# Patient Record
Sex: Female | Born: 1935 | Race: White | Hispanic: No | Marital: Single | Smoking: Never smoker
Health system: Southern US, Community
[De-identification: ages and names within clinical notes are randomized; demographics above are authoritative.]

## PROBLEM LIST (undated history)

## (undated) DIAGNOSIS — K222 Esophageal obstruction: Secondary | ICD-10-CM

## (undated) DIAGNOSIS — C801 Malignant (primary) neoplasm, unspecified: Secondary | ICD-10-CM

## (undated) DIAGNOSIS — K648 Other hemorrhoids: Secondary | ICD-10-CM

## (undated) DIAGNOSIS — I10 Essential (primary) hypertension: Secondary | ICD-10-CM

## (undated) DIAGNOSIS — K2980 Duodenitis without bleeding: Secondary | ICD-10-CM

## (undated) DIAGNOSIS — K2100 Gastro-esophageal reflux disease with esophagitis, without bleeding: Secondary | ICD-10-CM

## (undated) DIAGNOSIS — I639 Cerebral infarction, unspecified: Secondary | ICD-10-CM

## (undated) DIAGNOSIS — E785 Hyperlipidemia, unspecified: Secondary | ICD-10-CM

## (undated) DIAGNOSIS — M810 Age-related osteoporosis without current pathological fracture: Secondary | ICD-10-CM

## (undated) DIAGNOSIS — K5730 Diverticulosis of large intestine without perforation or abscess without bleeding: Secondary | ICD-10-CM

## (undated) HISTORY — DX: Duodenitis without bleeding: K29.80

## (undated) HISTORY — DX: Gastro-esophageal reflux disease with esophagitis, without bleeding: K21.00

## (undated) HISTORY — DX: Gastro-esophageal reflux disease with esophagitis: K21.0

## (undated) HISTORY — DX: Malignant (primary) neoplasm, unspecified: C80.1

## (undated) HISTORY — DX: Diverticulosis of large intestine without perforation or abscess without bleeding: K57.30

## (undated) HISTORY — DX: Esophageal obstruction: K22.2

## (undated) HISTORY — DX: Other hemorrhoids: K64.8

---

## 1998-02-27 ENCOUNTER — Other Ambulatory Visit: Admission: RE | Admit: 1998-02-27 | Discharge: 1998-02-27 | Payer: Self-pay | Admitting: Obstetrics and Gynecology

## 1998-12-21 ENCOUNTER — Inpatient Hospital Stay (HOSPITAL_COMMUNITY): Admission: EM | Admit: 1998-12-21 | Discharge: 1998-12-23 | Payer: Self-pay | Admitting: *Deleted

## 1998-12-21 ENCOUNTER — Encounter: Payer: Self-pay | Admitting: *Deleted

## 1999-03-01 ENCOUNTER — Other Ambulatory Visit: Admission: RE | Admit: 1999-03-01 | Discharge: 1999-03-01 | Payer: Self-pay | Admitting: Obstetrics and Gynecology

## 2000-02-29 ENCOUNTER — Other Ambulatory Visit: Admission: RE | Admit: 2000-02-29 | Discharge: 2000-02-29 | Payer: Self-pay | Admitting: Obstetrics and Gynecology

## 2000-10-06 ENCOUNTER — Encounter: Payer: Self-pay | Admitting: Obstetrics and Gynecology

## 2000-10-06 ENCOUNTER — Encounter: Admission: RE | Admit: 2000-10-06 | Discharge: 2000-10-06 | Payer: Self-pay | Admitting: Obstetrics and Gynecology

## 2001-03-02 ENCOUNTER — Other Ambulatory Visit: Admission: RE | Admit: 2001-03-02 | Discharge: 2001-03-02 | Payer: Self-pay | Admitting: Obstetrics and Gynecology

## 2002-02-24 ENCOUNTER — Other Ambulatory Visit: Admission: RE | Admit: 2002-02-24 | Discharge: 2002-02-24 | Payer: Self-pay | Admitting: Obstetrics and Gynecology

## 2002-12-04 ENCOUNTER — Inpatient Hospital Stay (HOSPITAL_COMMUNITY): Admission: EM | Admit: 2002-12-04 | Discharge: 2002-12-10 | Payer: Self-pay | Admitting: Emergency Medicine

## 2002-12-04 ENCOUNTER — Encounter: Payer: Self-pay | Admitting: Emergency Medicine

## 2002-12-05 ENCOUNTER — Encounter: Payer: Self-pay | Admitting: Neurology

## 2002-12-06 ENCOUNTER — Encounter (INDEPENDENT_AMBULATORY_CARE_PROVIDER_SITE_OTHER): Payer: Self-pay | Admitting: *Deleted

## 2003-02-01 ENCOUNTER — Encounter: Admission: RE | Admit: 2003-02-01 | Discharge: 2003-02-01 | Payer: Self-pay | Admitting: Internal Medicine

## 2003-06-25 ENCOUNTER — Emergency Department (HOSPITAL_COMMUNITY): Admission: EM | Admit: 2003-06-25 | Discharge: 2003-06-26 | Payer: Self-pay | Admitting: Emergency Medicine

## 2003-07-20 ENCOUNTER — Encounter (INDEPENDENT_AMBULATORY_CARE_PROVIDER_SITE_OTHER): Payer: Self-pay | Admitting: Specialist

## 2003-07-20 ENCOUNTER — Ambulatory Visit (HOSPITAL_COMMUNITY): Admission: RE | Admit: 2003-07-20 | Discharge: 2003-07-21 | Payer: Self-pay | Admitting: Orthopaedic Surgery

## 2004-02-28 ENCOUNTER — Other Ambulatory Visit: Admission: RE | Admit: 2004-02-28 | Discharge: 2004-02-28 | Payer: Self-pay | Admitting: Obstetrics and Gynecology

## 2004-03-04 HISTORY — PX: ESOPHAGOGASTRODUODENOSCOPY: SHX1529

## 2004-03-04 HISTORY — PX: COLONOSCOPY: SHX174

## 2004-04-23 ENCOUNTER — Ambulatory Visit: Payer: Self-pay | Admitting: Internal Medicine

## 2004-04-24 ENCOUNTER — Ambulatory Visit: Payer: Self-pay | Admitting: Internal Medicine

## 2004-05-08 ENCOUNTER — Ambulatory Visit: Payer: Self-pay | Admitting: Internal Medicine

## 2004-05-18 ENCOUNTER — Ambulatory Visit: Payer: Self-pay | Admitting: Internal Medicine

## 2004-07-25 ENCOUNTER — Encounter (INDEPENDENT_AMBULATORY_CARE_PROVIDER_SITE_OTHER): Payer: Self-pay | Admitting: Specialist

## 2004-07-25 ENCOUNTER — Ambulatory Visit: Payer: Self-pay | Admitting: Physical Medicine & Rehabilitation

## 2004-07-25 ENCOUNTER — Inpatient Hospital Stay (HOSPITAL_COMMUNITY): Admission: RE | Admit: 2004-07-25 | Discharge: 2004-08-03 | Payer: Self-pay | Admitting: General Surgery

## 2004-08-09 ENCOUNTER — Ambulatory Visit (HOSPITAL_COMMUNITY): Admission: RE | Admit: 2004-08-09 | Discharge: 2004-08-09 | Payer: Self-pay | Admitting: General Surgery

## 2006-08-05 ENCOUNTER — Other Ambulatory Visit: Admission: RE | Admit: 2006-08-05 | Discharge: 2006-08-05 | Payer: Self-pay | Admitting: Obstetrics and Gynecology

## 2007-09-08 ENCOUNTER — Ambulatory Visit (HOSPITAL_COMMUNITY): Admission: RE | Admit: 2007-09-08 | Discharge: 2007-09-08 | Payer: Self-pay | Admitting: Internal Medicine

## 2008-07-05 ENCOUNTER — Inpatient Hospital Stay (HOSPITAL_COMMUNITY): Admission: EM | Admit: 2008-07-05 | Discharge: 2008-07-11 | Payer: Self-pay | Admitting: Emergency Medicine

## 2009-11-09 ENCOUNTER — Inpatient Hospital Stay (HOSPITAL_COMMUNITY): Admission: EM | Admit: 2009-11-09 | Discharge: 2009-12-04 | Payer: Self-pay | Admitting: Emergency Medicine

## 2009-11-09 ENCOUNTER — Encounter (INDEPENDENT_AMBULATORY_CARE_PROVIDER_SITE_OTHER): Payer: Self-pay | Admitting: General Surgery

## 2010-01-20 ENCOUNTER — Emergency Department (HOSPITAL_COMMUNITY)
Admission: EM | Admit: 2010-01-20 | Discharge: 2010-01-20 | Payer: Self-pay | Source: Home / Self Care | Admitting: Emergency Medicine

## 2010-05-17 LAB — CBC
HCT: 29.5 % — ABNORMAL LOW (ref 36.0–46.0)
HCT: 26.1 % — ABNORMAL LOW (ref 36.0–46.0)
HCT: 29.2 % — ABNORMAL LOW (ref 36.0–46.0)
HCT: 33.2 % — ABNORMAL LOW (ref 36.0–46.0)
Hemoglobin: 10.6 g/dL — ABNORMAL LOW (ref 12.0–15.0)
Hemoglobin: 9.9 g/dL — ABNORMAL LOW (ref 12.0–15.0)
Hemoglobin: 12.5 g/dL (ref 12.0–15.0)
Hemoglobin: 7.6 g/dL — ABNORMAL LOW (ref 12.0–15.0)
Hemoglobin: 8.9 g/dL — ABNORMAL LOW (ref 12.0–15.0)
MCH: 32 pg (ref 26.0–34.0)
MCH: 32.7 pg (ref 26.0–34.0)
MCH: 32.7 pg (ref 26.0–34.0)
MCH: 33.2 pg (ref 26.0–34.0)
MCHC: 34.2 g/dL (ref 30.0–36.0)
MCHC: 34.3 g/dL (ref 30.0–36.0)
MCV: 95.6 fL (ref 78.0–100.0)
MCV: 95.8 fL (ref 78.0–100.0)
MCV: 93.4 fL (ref 78.0–100.0)
MCV: 96.3 fL (ref 78.0–100.0)
MCV: 96.7 fL (ref 78.0–100.0)
Platelets: 290 10*3/uL (ref 150–400)
Platelets: 337 10*3/uL (ref 150–400)
Platelets: 152 10*3/uL (ref 150–400)
Platelets: 208 10*3/uL (ref 150–400)
Platelets: 210 10*3/uL (ref 150–400)
RBC: 2.28 MIL/uL — ABNORMAL LOW (ref 3.87–5.11)
RBC: 3.01 MIL/uL — ABNORMAL LOW (ref 3.87–5.11)
RBC: 3.55 MIL/uL — ABNORMAL LOW (ref 3.87–5.11)
RBC: 3.83 MIL/uL — ABNORMAL LOW (ref 3.87–5.11)
RDW: 13.4 % (ref 11.5–15.5)
RDW: 13.8 % (ref 11.5–15.5)
RDW: 13.9 % (ref 11.5–15.5)
RDW: 13.9 % (ref 11.5–15.5)
WBC: 6.5 10*3/uL (ref 4.0–10.5)
WBC: 7.3 10*3/uL (ref 4.0–10.5)
WBC: 10.1 10*3/uL (ref 4.0–10.5)
WBC: 13.7 10*3/uL — ABNORMAL HIGH (ref 4.0–10.5)
WBC: 6.9 10*3/uL (ref 4.0–10.5)
WBC: 8.1 10*3/uL (ref 4.0–10.5)

## 2010-05-17 LAB — COMPREHENSIVE METABOLIC PANEL
ALT: 22 U/L (ref 0–35)
ALT: 23 U/L (ref 0–35)
ALT: 12 U/L (ref 0–35)
AST: 30 U/L (ref 0–37)
AST: 23 U/L (ref 0–37)
Albumin: 2.5 g/dL — ABNORMAL LOW (ref 3.5–5.2)
Albumin: 2.7 g/dL — ABNORMAL LOW (ref 3.5–5.2)
Albumin: 2.9 g/dL — ABNORMAL LOW (ref 3.5–5.2)
Albumin: 4.5 g/dL (ref 3.5–5.2)
Alkaline Phosphatase: 50 U/L (ref 39–117)
Alkaline Phosphatase: 53 U/L (ref 39–117)
Alkaline Phosphatase: 63 U/L (ref 39–117)
Alkaline Phosphatase: 76 U/L (ref 39–117)
Alkaline Phosphatase: 51 U/L (ref 39–117)
BUN: 14 mg/dL (ref 6–23)
BUN: 26 mg/dL — ABNORMAL HIGH (ref 6–23)
BUN: 7 mg/dL (ref 6–23)
CO2: 23 mEq/L (ref 19–32)
CO2: 31 mEq/L (ref 19–32)
CO2: 33 mEq/L — ABNORMAL HIGH (ref 19–32)
CO2: 24 mEq/L (ref 19–32)
Calcium: 8.4 mg/dL (ref 8.4–10.5)
Calcium: 8.8 mg/dL (ref 8.4–10.5)
Chloride: 100 mEq/L (ref 96–112)
Chloride: 103 mEq/L (ref 96–112)
Chloride: 100 mEq/L (ref 96–112)
Creatinine, Ser: 0.78 mg/dL (ref 0.4–1.2)
Creatinine, Ser: 0.86 mg/dL (ref 0.4–1.2)
Creatinine, Ser: 0.87 mg/dL (ref 0.4–1.2)
GFR calc Af Amer: >60 mL/min (ref >60)
GFR calc Af Amer: >60 mL/min (ref >60)
GFR calc Af Amer: >60 mL/min (ref >60)
GFR calc Af Amer: >60 mL/min (ref >60)
GFR calc Af Amer: 47 mL/min — ABNORMAL LOW (ref >60)
GFR calc non Af Amer: 58 mL/min — ABNORMAL LOW (ref >60)
GFR calc non Af Amer: >60 mL/min (ref >60)
GFR calc non Af Amer: 39 mL/min — ABNORMAL LOW (ref >60)
Glucose, Bld: 123 mg/dL — ABNORMAL HIGH (ref 70–99)
Glucose, Bld: 137 mg/dL — ABNORMAL HIGH (ref 70–99)
Glucose, Bld: 88 mg/dL (ref 70–99)
Potassium: 3.6 mEq/L (ref 3.5–5.1)
Potassium: 3.8 mEq/L (ref 3.5–5.1)
Potassium: 3.9 mEq/L (ref 3.5–5.1)
Potassium: 4.3 mEq/L (ref 3.5–5.1)
Sodium: 135 mEq/L (ref 135–145)
Sodium: 138 mEq/L (ref 135–145)
Sodium: 140 mEq/L (ref 135–145)
Total Bilirubin: 0.3 mg/dL (ref 0.3–1.2)
Total Bilirubin: 0.3 mg/dL (ref 0.3–1.2)
Total Bilirubin: 0.6 mg/dL (ref 0.3–1.2)
Total Bilirubin: 0.7 mg/dL (ref 0.3–1.2)
Total Protein: 5.2 g/dL — ABNORMAL LOW (ref 6.0–8.3)
Total Protein: 5.4 g/dL — ABNORMAL LOW (ref 6.0–8.3)
Total Protein: 5.8 g/dL — ABNORMAL LOW (ref 6.0–8.3)
Total Protein: 5.9 g/dL — ABNORMAL LOW (ref 6.0–8.3)
Total Protein: 5.9 g/dL — ABNORMAL LOW (ref 6.0–8.3)

## 2010-05-17 LAB — PHOSPHORUS
Phosphorus: 3.3 mg/dL (ref 2.3–4.6)
Phosphorus: 3.5 mg/dL (ref 2.3–4.6)
Phosphorus: 4.1 mg/dL (ref 2.3–4.6)
Phosphorus: 4.3 mg/dL (ref 2.3–4.6)

## 2010-05-17 LAB — GLUCOSE, CAPILLARY
Glucose-Capillary: 111 mg/dL — ABNORMAL HIGH (ref 70–99)
Glucose-Capillary: 112 mg/dL — ABNORMAL HIGH (ref 70–99)
Glucose-Capillary: 113 mg/dL — ABNORMAL HIGH (ref 70–99)
Glucose-Capillary: 120 mg/dL — ABNORMAL HIGH (ref 70–99)
Glucose-Capillary: 127 mg/dL — ABNORMAL HIGH (ref 70–99)
Glucose-Capillary: 130 mg/dL — ABNORMAL HIGH (ref 70–99)
Glucose-Capillary: 133 mg/dL — ABNORMAL HIGH (ref 70–99)
Glucose-Capillary: 135 mg/dL — ABNORMAL HIGH (ref 70–99)
Glucose-Capillary: 135 mg/dL — ABNORMAL HIGH (ref 70–99)
Glucose-Capillary: 136 mg/dL — ABNORMAL HIGH (ref 70–99)
Glucose-Capillary: 137 mg/dL — ABNORMAL HIGH (ref 70–99)
Glucose-Capillary: 139 mg/dL — ABNORMAL HIGH (ref 70–99)
Glucose-Capillary: 141 mg/dL — ABNORMAL HIGH (ref 70–99)
Glucose-Capillary: 142 mg/dL — ABNORMAL HIGH (ref 70–99)
Glucose-Capillary: 144 mg/dL — ABNORMAL HIGH (ref 70–99)
Glucose-Capillary: 147 mg/dL — ABNORMAL HIGH (ref 70–99)
Glucose-Capillary: 147 mg/dL — ABNORMAL HIGH (ref 70–99)
Glucose-Capillary: 154 mg/dL — ABNORMAL HIGH (ref 70–99)

## 2010-05-17 LAB — BASIC METABOLIC PANEL
BUN: 24 mg/dL — ABNORMAL HIGH (ref 6–23)
BUN: 5 mg/dL — ABNORMAL LOW (ref 6–23)
BUN: 16 mg/dL (ref 6–23)
CO2: 22 mEq/L (ref 19–32)
CO2: 30 mEq/L (ref 19–32)
CO2: 31 mEq/L (ref 19–32)
Calcium: 8.5 mg/dL (ref 8.4–10.5)
Calcium: 8.9 mg/dL (ref 8.4–10.5)
Calcium: 9 mg/dL (ref 8.4–10.5)
Chloride: 101 mEq/L (ref 96–112)
Creatinine, Ser: 0.64 mg/dL (ref 0.4–1.2)
Creatinine, Ser: 1.03 mg/dL (ref 0.4–1.2)
GFR calc Af Amer: >60 mL/min (ref >60)
GFR calc Af Amer: >60 mL/min (ref >60)
GFR calc Af Amer: 54 mL/min — ABNORMAL LOW (ref >60)
GFR calc non Af Amer: >60 mL/min (ref >60)
GFR calc non Af Amer: >60 mL/min (ref >60)
GFR calc non Af Amer: 45 mL/min — ABNORMAL LOW (ref >60)
GFR calc non Af Amer: 51 mL/min — ABNORMAL LOW (ref >60)
Glucose, Bld: 124 mg/dL — ABNORMAL HIGH (ref 70–99)
Glucose, Bld: 130 mg/dL — ABNORMAL HIGH (ref 70–99)
Glucose, Bld: 131 mg/dL — ABNORMAL HIGH (ref 70–99)
Glucose, Bld: 89 mg/dL (ref 70–99)
Glucose, Bld: 80 mg/dL (ref 70–99)
Potassium: 4 mEq/L (ref 3.5–5.1)
Potassium: 4.4 mEq/L (ref 3.5–5.1)
Potassium: 4.3 mEq/L (ref 3.5–5.1)
Potassium: 5 mEq/L (ref 3.5–5.1)
Sodium: 136 mEq/L (ref 135–145)
Sodium: 140 mEq/L (ref 135–145)
Sodium: 138 mEq/L (ref 135–145)

## 2010-05-17 LAB — DIFFERENTIAL
Basophils Absolute: 0 10*3/uL (ref 0.0–0.1)
Basophils Absolute: 0.1 10*3/uL (ref 0.0–0.1)
Basophils Absolute: 0 10*3/uL (ref 0.0–0.1)
Basophils Relative: 0 % (ref 0–1)
Basophils Relative: 0 % (ref 0–1)
Eosinophils Absolute: 0.1 10*3/uL (ref 0.0–0.7)
Eosinophils Absolute: 0 10*3/uL (ref 0.0–0.7)
Eosinophils Relative: 0 % (ref 0–5)
Lymphocytes Relative: 14 % (ref 12–46)
Lymphs Abs: 0.6 10*3/uL — ABNORMAL LOW (ref 0.7–4.0)
Monocytes Absolute: 0.7 10*3/uL (ref 0.1–1.0)
Monocytes Absolute: 0.4 10*3/uL (ref 0.1–1.0)
Monocytes Relative: 11 % (ref 3–12)
Monocytes Relative: 9 % (ref 3–12)
Monocytes Relative: 9 % (ref 3–12)
Neutro Abs: 4.6 10*3/uL (ref 1.7–7.7)
Neutro Abs: 5 10*3/uL (ref 1.7–7.7)
Neutro Abs: 5.8 10*3/uL (ref 1.7–7.7)
Neutrophils Relative %: 72 % (ref 43–77)
Neutrophils Relative %: 77 % (ref 43–77)
Neutrophils Relative %: 80 % — ABNORMAL HIGH (ref 43–77)

## 2010-05-17 LAB — URINALYSIS, ROUTINE W REFLEX MICROSCOPIC
Bilirubin Urine: NEGATIVE
Bilirubin Urine: NEGATIVE
Hgb urine dipstick: NEGATIVE
Hgb urine dipstick: NEGATIVE
Ketones, ur: 40 mg/dL — AB
Nitrite: NEGATIVE
Protein, ur: NEGATIVE mg/dL
Specific Gravity, Urine: 1.011 (ref 1.005–1.030)
Specific Gravity, Urine: 1.014 (ref 1.005–1.030)
Urobilinogen, UA: 0.2 mg/dL (ref 0.0–1.0)
Urobilinogen, UA: 0.2 mg/dL (ref 0.0–1.0)
pH: 6 (ref 5.0–8.0)

## 2010-05-17 LAB — LACTIC ACID, PLASMA: Lactic Acid, Venous: 1.1 mmol/L (ref 0.5–2.2)

## 2010-05-17 LAB — CROSSMATCH

## 2010-05-17 LAB — PREALBUMIN
Prealbumin: 13.8 mg/dL — ABNORMAL LOW (ref 18.0–45.0)
Prealbumin: 7.1 mg/dL — ABNORMAL LOW (ref 18.0–45.0)

## 2010-05-17 LAB — MAGNESIUM
Magnesium: 2 mg/dL (ref 1.5–2.5)
Magnesium: 2.1 mg/dL (ref 1.5–2.5)

## 2010-05-17 LAB — CHOLESTEROL, TOTAL
Cholesterol: 143 mg/dL (ref 0–200)
Cholesterol: 187 mg/dL (ref 0–200)

## 2010-05-17 LAB — URINE CULTURE: Special Requests: NEGATIVE

## 2010-05-17 LAB — POCT CARDIAC MARKERS: Myoglobin, poc: 59.5 ng/mL (ref 12–200)

## 2010-05-17 LAB — MRSA PCR SCREENING: MRSA by PCR: NEGATIVE

## 2010-05-17 LAB — LIPASE, BLOOD: Lipase: 25 U/L (ref 11–59)

## 2010-06-12 LAB — URINALYSIS, ROUTINE W REFLEX MICROSCOPIC
Bilirubin Urine: NEGATIVE
Glucose, UA: NEGATIVE mg/dL
Leukocytes, UA: NEGATIVE
Nitrite: NEGATIVE
Nitrite: POSITIVE — AB
Protein, ur: NEGATIVE mg/dL
Specific Gravity, Urine: 1.006 (ref 1.005–1.030)
pH: 6 (ref 5.0–8.0)
pH: 6 (ref 5.0–8.0)

## 2010-06-12 LAB — COMPREHENSIVE METABOLIC PANEL
ALT: 30 U/L (ref 0–35)
AST: 33 U/L (ref 0–37)
Albumin: 2.9 g/dL — ABNORMAL LOW (ref 3.5–5.2)
Alkaline Phosphatase: 148 U/L — ABNORMAL HIGH (ref 39–117)
BUN: 23 mg/dL (ref 6–23)
CO2: 26 mEq/L (ref 19–32)
Calcium: 8.1 mg/dL — ABNORMAL LOW (ref 8.4–10.5)
Calcium: 9.2 mg/dL (ref 8.4–10.5)
Creatinine, Ser: 0.94 mg/dL (ref 0.4–1.2)
GFR calc Af Amer: >60 mL/min (ref >60)
GFR calc non Af Amer: 57 mL/min — ABNORMAL LOW (ref >60)
Glucose, Bld: 101 mg/dL — ABNORMAL HIGH (ref 70–99)
Potassium: 3.6 mEq/L (ref 3.5–5.1)
Sodium: 136 mEq/L (ref 135–145)
Total Protein: 5.8 g/dL — ABNORMAL LOW (ref 6.0–8.3)

## 2010-06-12 LAB — VITAMIN D 1,25 DIHYDROXY: Vitamin D3 1, 25 (OH)2: 60 pg/mL

## 2010-06-12 LAB — CBC
HCT: 37.6 % (ref 36.0–46.0)
Hemoglobin: 12.6 g/dL (ref 12.0–15.0)
Hemoglobin: 12.7 g/dL (ref 12.0–15.0)
MCHC: 33.7 g/dL (ref 30.0–36.0)
MCHC: 34.4 g/dL (ref 30.0–36.0)
MCV: 98.6 fL (ref 78.0–100.0)
Platelets: 292 10*3/uL (ref 150–400)
RBC: 3.82 MIL/uL — ABNORMAL LOW (ref 3.87–5.11)
RDW: 12.4 % (ref 11.5–15.5)

## 2010-06-12 LAB — URINE MICROSCOPIC-ADD ON

## 2010-06-12 LAB — DIFFERENTIAL
Basophils Absolute: 0 10*3/uL (ref 0.0–0.1)
Lymphocytes Relative: 6 % — ABNORMAL LOW (ref 12–46)
Lymphs Abs: 0.6 10*3/uL — ABNORMAL LOW (ref 0.7–4.0)
Neutrophils Relative %: 88 % — ABNORMAL HIGH (ref 43–77)

## 2010-07-17 NOTE — Discharge Summary (Signed)
NAMEROMEY, COHEA NO.:  [id number]   MEDICAL RECORD NO.:  [id number]          PATIENT TYPE:  INP   LOCATION:  1333                         FACILITY:  [facility]   PHYSICIAN:  Geoffry Paradise, M.D.  DATE OF BIRTH:  01-17-1926   DATE OF ADMISSION:  07/05/2008  DATE OF DISCHARGE:  07/11/2008                               DISCHARGE SUMMARY   DIAGNOSES OF THE TIME OF DISCHARGE:  1. Pubic rami and sacral insufficiency fractures.  2. Essential hypertension.  3. Depression.  4. 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  5. Paroxysmal atrial fibrillation, Coumadin in the past, off upon      admission.  6. Remote upper gastrointestinal hemorrhage.  7. Dysplastic polyp status post sigmoid colectomy 2006.  8. Osteoporosis with prior compression fractures status post      kyphoplasty.   HISTORY OF PRESENT ILLNESS:  Ms. Goosby is a pleasant 77 year old who  has recently lost her sister and brother-in-law, presenting at this time  after sustaining a fall several days prior to admission, hitting her  head and pelvis after tripping over a curb.  I was contacted  approximately two days prior to admission and urged her to present  either to the office or emergency room, but she declined, and despite  pushing by family refused.  We were contacted again the day of admission  with failure-to-thrive-type picture, weakness, inability to walk and  continued pain.  Ultimately, the family insisted on presentation to the  emergency room where radiographic workup revealed the pubic rami and  sacral insufficiency fractures but no hip fracture.  She has known  osteoporosis and has been on Reclast annually as well as vitamin D and  calcium supplementation.  She has also had vertebroplasty in the past.  She was admitted at this time for IV fluids and pain control.  For  details, see the dictated summary on the chart by my partner.   DATA:  Initial urinalysis negative.   Subsequent urinalysis 5 days later  on May 9 did reveal large blood, positive nitrate, small leukocytes.  CBC:  Hemoglobin 12.7, hematocrit 37.6, white blood cell count 11.1,  platelet count 281,000.  Repeat CBC:  Hemoglobin 12.6, hematocrit 36.6,  white blood cell count 6.9, platelet count 292,000.  Chemistries:  Sodium 136, potassium 3.6, chloride 104, CO2 23, glucose 85, BUN 8,  creatinine 0.67, alkaline phosphatase 148, SGOT 33, SGPT 30, protein  5.8, albumin is 2.9, calcium 8.1.  Chest x-ray:  No acute disease.  MRI  left hip without contrast:  Fractures of the left pubic body and  adjacent portion the left pubic rami with bilateral sacral fractures,  hematoma and edema in the left adductor, left lower vertebral  subluxations chronic.  Hip films:  Minimally displaced fracture left  medial superior pubic rami, no hip fracture.  Chest x-ray initially on  May 4 and repeated on May 9:  No acute disease.   HOSPITAL COURSE:  The patient was admitted, hydrated, placed on enemas  and laxatives as well as narcotics for pain control.  She was continued  on calcium and vitamin D  as well as her baseline aspirin regimen.  Mobic  was added for pain control and Lexapro for antidepressant activity.  She  was placed on Lovenox by order on May 4 as well as again on Jul 10, 2008,  as it remained unclear as to whether she was receiving this.  I have  asked nursing staff to look into this.  She has remained stable from a  cardiopulmonary standpoint clinically in normal sinus rhythm.  Abdominal  exam is benign.  We have started some Cipro empirically for possible  UTI.  Pain remains a significant issue leading to immobility.  She will  need Lovenox indefinitely.  The facility may choose to convert this to  Coumadin at their discretion.   The patient is discharged partial weightbearing as tolerated.  I did  have Dr. Eulah Pont review her case, and he has reviewed the films for  orthopedic surgery in detail  with the current treatment being all that  is indicated, nothing surgical.  He will need to see her at some point  in the next 2-4 weeks as she tolerates as an outpatient.   DISCHARGE MEDICATIONS:  1. Calcium 500 mg with 400 of vitamin D b.i.d.  2. Calcitonin nasal spray 1 spray alternating nares daily.  3. Aspirin 81 mg daily.  4. Benicar 20 mg daily.  5. Vitamin D 800 units additionally daily.  6. Meloxicam 7.5 daily.  7. Lexapro 10 mg daily.  8. MiraLax 17 g daily.  9. Lovenox 40 mg daily until the patient is mobile.  10.Ambien 5 mg p.o. every night p.r.n. insomnia.  11.Skelaxin 800 mg p.o. t.i.d. p.r.n. muscle spasms.  12.Phenergan 12.5 p.o. q.4 h. p.r.n. nausea, vomiting.  13.Vicodin 5/500 one to two p.o. q.4 h. as needed for pain.  14.Please note she is already been on a schedule of Reclast yearly as      an outpatient.  This is not due for its annual course yet.  15.She is also on Cipro started today, 500 mg p.o. b.i.d. for 7 days.           ______________________________  Geoffry Paradise, M.D.     RA/MEDQ  D:  07/11/2008  T:  07/11/2008  Job:  [id number]

## 2010-07-17 NOTE — H&P (Signed)
NAMEMarland Kitchen  Wendy Quinn, Wendy Quinn NO.:  [id number]   MEDICAL RECORD NO.:  [id number]          PATIENT TYPE:  EMS   LOCATION:  ED                           FACILITY:  [facility]   PHYSICIAN:  Gwen Pounds, MD       DATE OF BIRTH:  February 08, 1926   DATE OF ADMISSION:  07/05/2008  DATE OF DISCHARGE:                              HISTORY & PHYSICAL   CHIEF COMPLAINT:  Pelvic fracture.  Pubic ramus fracture.  Pain.  Failure to thrive.   HISTORY PRESENT ILLNESS:  An 77 year old female here for fall history,  hit her head and pelvis tripping over a curb about 7 to 9 days ago.  I  was called today with failure to thrive, weakness, cannot walk, cannot  bear weight and sent her to the emergency department.  In the emergency  department she was diagnosed pubic ramus fracture, sacral fracture.  No  femur fracture by x-ray and MRI.  The patient with known severe  osteoporosis, status post Reclast and status post vertebroplasty in the  past.  I was called for inpatient admission which she surely needs.  In  the ED, she is given morphine, IV fluids, and Zofran.  She denied any  loss of consciousness or neurological loss in terms of the fall.  No  chest pain.   PAST MEDICAL HISTORY:  1. History of right posterior cerebral artery infarct and October      2004.  2. Hyperlipidemia, status post nonmalignant tumor resection in May      2006 complicated by GI hemorrhage and atrial fibrillation.  3. History of paroxysmal atrial fibrillation.  Currently off Coumadin.  4. Hypertension.  5. GERD.  6. Osteoporosis status post kyphoplasty.  7. Osteoarthritis.  8. Status post appendectomy.  9. Multilevel degenerative disk disease.  10.Stress test in September 2003 was negative.   ALLERGIES:  CODEINE and PERCOCET.   MEDICATIONS:  1. Calcium b.i.d.  2. Darvocet p.r.n.  3. Mobic 7.5 daily.  4. Aspirin 81 daily.  5. Tylenol PM at bedtime.  6. Benicar 40 mg one-half daily.  7. Vitamin D 40 b.i.d.  8.  Diprolene steroid cream as needed.  9. Reclast on September 08, 2007.   SOCIAL HISTORY:  She is retired.  No tobacco.  No alcohol.   FAMILY HISTORY:  Coronary artery disease, diabetes and breast cancer.   REVIEW OF SYSTEMS:  She fell approximately 1 week ago, worse issues the  last 2 to 3 days of pain and an changes in function.  She is weak but  denies any chest pain, shortness of breath, abdominal bowel or GI  related issues.  Denies any nausea, vomiting or diarrhea.  Denies any  recent loss of consciousness.  Only things are the musculoskeletal  issues.   PHYSICAL EXAM:  VITAL SIGNS:  Blood pressure 131/44 up to 152/53, heart  rate 85, respiratory rate 18, saturation 96% on room air.  GENERAL:  Alert and oriented x3.  NECK:  Pigment changes.  SKIN:  Very suntanned.  HEENT:  Oropharynx is clear.  Moist.  Eyes PERRLA, EOMI.  NEURO:  Generally intact.  Able to wiggle all of her toes.  Good grip  strength.  Good upper body strength.  Difficult to assess for lower body  strength due to hurts too much, but she does try to lift her leg up.  CARDIAC:  Regular.  ABDOMEN:  Soft with a big scar noted.  EXTREMITIES:  Venous stasis bruising and minimal edema.   ANCILLARY DATA:  Tetanus was given in 2003.  Pneumonia vaccine was given  2007.  Sodium 140, potassium 4.3, chloride 104, bicarb 26, BUN 23,  creatinine 0.94, glucose 101 white count 11.1, hemoglobin 12.7, platelet  count 281,000.  Urinalysis negative except 3 to 6 red blood cells high-  power field.  LFTs were normal.  Chest x-ray no acute cardiopulmonary  disease except changes consistent with emphysema.  X-rays and MRIs show  pubic ramus fracture and sacral fracture.   ASSESSMENT/PLAN:  This is an elderly female who has got severe  osteoporosis, status post fall and injury approximately 1 week ago and  has led to an pubic ramus and sacral insufficiency fracture female with  failure to thrive for readmission.   PLAN:  1. Admit.  2.  Pain control.  3. PT/OT case worker.  4. Will probably need rehab on discharge.  5. May need ortho, but nothing surgical going on.  I will hold on      consult tonight.  6. Foley catheter p.r.n.  7. Miacalcin for help.  8. Lovenox for DVT prophylaxis.  9. May need TL-spine x-ray.  10.Home medications.  11.Dr. Jacky Kindle will see in the morning and take over care.      Gwen Pounds, MD  Electronically Signed     JMR/MEDQ  D:  07/05/2008  T:  07/05/2008  Job:  [id number]

## 2010-07-20 NOTE — Discharge Summary (Signed)
NAMEDALIYA, PARCHMENT NO.:  [id number]   MEDICAL RECORD NO.:  [id number]            PATIENT TYPE:   LOCATION:                                 FACILITY:   PHYSICIAN:  Ollen Gross. Vernell Morgans, M.D.      DATE OF BIRTH:   DATE OF ADMISSION:  07/25/2004  DATE OF DISCHARGE:  08/03/2004                                 DISCHARGE SUMMARY   Ms. Medici is a 77 year old white female who was known to have a  dysplastic polyp in her sigmoid colon.  She was brought to the operating  room on May 24, where she underwent a sigmoid colectomy.  She tolerated this  well.  Her postoperative course was complicated by a wound hematoma that  required opening of the wound and dressing changes.  Her hemoglobin remained  stable.  On postop day #2 she was started on sips of liquids and her diet  was very gradually advanced as she tolerated it.  Initially she felt a  little weak and physical therapy was consulted.  It was felt she might be a  good candidate for S[facility] or rehab but after working with physical therapy and  evaluating her, she was eventually ready to go straight home and she was  discharged on June 2.   MEDICATIONS AT THE TIME OF DISCHARGE:  She was to resume her home  medications and she was given a prescription for Vicodin for pain.   ACTIVITY:  No heavy lifting.   DIET:  As tolerated.   FOLLOW-UP:  With Dr. Carolynne Edouard in a week for a wound check, and she has home  health nursing arranged for dressing changes.   CONDITION:  Stable.   FINAL DIAGNOSIS:  Dysplastic sigmoid polyp.   The patient is discharged home.      Ollen Gross. Vernell Morgans, M.D.  Electronically Signed     PST/MEDQ  D:  08/08/2005  T:  08/08/2005  Job:  [id number]

## 2010-07-20 NOTE — Discharge Summary (Signed)
Wendy Quinn, Wendy Quinn                     ACCOUNT NO.:  [id number]   MEDICAL RECORD NO.:  [id number]                   PATIENT TYPE:  INP   LOCATION:  3033                                 FACILITY:  [facility]   PHYSICIAN:  Pramod P. Pearlean Brownie, MD                 DATE OF BIRTH:  31-Aug-1925   DATE OF ADMISSION:  12/04/2002  DATE OF DISCHARGE:  12/10/2002                                 DISCHARGE SUMMARY   ADMISSION DIAGNOSIS:  Stroke.   DISCHARGE DIAGNOSIS:  Multiple right posterior cerebral artery infarction  from atheroembolism from the right posterior cerebral artery stenosis.   HISTORY OF PRESENT ILLNESS:  The patient is a pleasant 77 year old lady who  was admitted for evaluation for sudden onset of changes in her vision, left-  sided numbness.  This cleared by the time she came to the hospital.  When  evaluated in the emergency room, a CAT scan of the head was unremarkable.  Neurological examination demonstrated inferior quadrantanopsia on the left  side.  She had no sensory or motor abnormalities.  She was admitted for  stroke risk stratification workup to the stroke service.   HOSPITAL COURSE:  She underwent an EKG which revealed normal sinus rhythm  without any evidence of acute ischemia.  MRI scan of the brain revealed  multiple small infarcts in the right posterior cerebral artery distribution  without any significant hemorrhage.  MRA of the brain revealed poor flow in  the right posterior cerebral artery.  Carotid Dopplers revealed mild  bilateral clots with 40 to 60% right internal carotid artery stenosis.  Two-  dimensional echocardiogram revealed ejection fraction of 50 to 55% and  technically was a poor quality study which could not definitely exclude  cardiogenic source of embolism.  Transcranial Doppler studies were normal  without significant anterior and posterior circulation.  Low velocities in  both internal carotid arteries were felt to be due to suboptimal  waveforms.  The patient's lipid profile showed elevated cholesterol of 230,  triglycerides were normal at 82, HDL 75, and LDL 139.  Homocysteine was  normal at 7.7.  Fasting glucose was 100.  The patient was seen in  consultation by speech therapy and occupational therapy.  The patient had  been on aspirin prior to presenting with this present stroke.  She was  switched to IV heparin and subsequently Coumadin for stroke prevention due  to her significant intracranial atherosclerosis.  On the day of discharge,  her INR was 1.7 and she was given one dose of Lovenox injection and advised  to follow up in the Coumadin Clinic for subsequent adjustments of her  Coumadin dose.   DISCHARGE MEDICATIONS:  1. Zocor 20 mg a day.  2. Coumadin 7.5 mg dose to be adjusted to keep INR 2 to 3.  3. Darvocet one tablet as needed.  4. Senna/docusate sodium one tablet as needed for laxative.    DISCHARGE INSTRUCTIONS:  The patient was advised to follow up with her  primary physician, Geoffry Paradise, M.D., within a week and with Pramod P.  Pearlean Brownie, M.D. in his office in two weeks.                                                Pramod P. Pearlean Brownie, MD    PPS/MEDQ  D:  01/13/2003  T:  01/13/2003  Job:  [id number]   cc:   Geoffry Paradise, M.D.  [address]  [place]  [place] [address]  Fax: [phone]

## 2010-07-20 NOTE — Op Note (Signed)
NAME:  Wendy Quinn, Wendy Quinn                     ACCOUNT NO.:  [id number]   MEDICAL RECORD NO.:  [id number]                   PATIENT TYPE:  OIB   LOCATION:  5032                                 FACILITY:  [facility]   PHYSICIAN:  Sharolyn Douglas, M.D.                     DATE OF BIRTH:  06/13/1925   DATE OF PROCEDURE:  07/20/2003  DATE OF DISCHARGE:                                 OPERATIVE REPORT   ADDENDUM:  It should be noted that after the working cannula was  established, a trocar was used to obtain a core bone biopsy.  This was sent  to pathology.                                               Sharolyn Douglas, M.D.    MC/MEDQ  D:  07/20/2003  T:  07/21/2003  Job:  [id number]

## 2010-07-20 NOTE — Op Note (Signed)
Wendy Quinn, Wendy Quinn           ACCOUNT NO.:  [id number]   MEDICAL RECORD NO.:  [id number]          PATIENT TYPE:  INP   LOCATION:  2550                         FACILITY:  [facility]   PHYSICIAN:  Ollen Gross. Vernell Morgans, M.D. DATE OF BIRTH:  [birth year]   DATE OF PROCEDURE:  07/25/2004  DATE OF DISCHARGE:                                 OPERATIVE REPORT   PREOPERATIVE DIAGNOSIS:  Dysplastic sigmoid polyp.   POSTOPERATIVE DIAGNOSES:  Dysplastic sigmoid polyp.   PROCEDURE:  Sigmoid colectomy and rigid sigmoidoscopy.   SURGEON:  Ollen Gross. Carolynne Edouard, M.D.   ANESTHESIA:  General endotracheal.   PROCEDURE:  After informed consent was obtained, the patient was brought to  the operating room, placed in supine position on the operating room table.  After adequate induction of general anesthesia, the patient was placed in  lithotomy position. A rigid sigmoidoscope was inserted into the rectum to  evaluate the rectosigmoid. The scope was able to be advanced to about 20 cm  and no tumor was identified and no tattooing was identified as well. At this  point, the patient was taken out of lithotomy position, placed supine on the  operating room table. The patient's abdomen was then prepped with Betadine  and draped in the usual sterile manner. A lower midline incision was made  with a 10 blade knife. This incision was carried down through the skin and  subcutaneous tissue sharply using the electrocautery until the linea alba  was identified. The linea alba was also incised with the electrocautery. The  preperitoneal space was then probed bluntly with a hemostat until the  peritoneum was opened and access was gained to the abdominal cavity. The  rest of the incision was then opened under direct vision.  Also using the  electrocautery,  a Balfour retractor was used to retract the sidewalls of  the abdomen laterally. A bladder blade was also placed for inferior  retraction. There were no adhesions.  The small  bowel was able to be tucked  in the upper abdomen using moist laps,  the sigmoid colon was very mobile  and easily brought up into the wound.  The sigmoid colon was inspected and  two small areas of tattooing were noted. A site was chosen several  centimeters proximal and distal to the site of the tattooing and at this  point the mesentery at the colon wall was opened sharply with the  electrocautery. At each of these points Allen clamp was placed on the stay  side and a Kocher clamp was placed on the go side and then bowel was divided  between these two clamps sharply with a 10 blade knife. The mesentery was  then scored with electrocautery and the mesenteric vessels supplying the  segment of the sigmoid colon were taken down by clamping with Kelly clamps,  divided and ligated with 2-0 silk ties each of these areas. Once this was  accomplished, the clamps were taken off the proximal and distal colon, they  reached each other very easily without any tension.  The posterior walls of  the colon were aligned and then the posterior  wall anastomosis was created  using interrupted 3-0 silk full-thickness stitches with the knots on the  inside. Once this was accomplished, the anterior wall of the anastomosis was  also created with full thickness simple 3-0 silk stitches keeping most of  the knots on the inside, the last or five stitches were thrown in a simple  manner with the knots on the outside. Once this was accomplished, the  anastomosis was inspected and was widely patent. The anastomosis looked good  and the bowel appeared to be healthy. The mesenteric defect was closed with  figure-of-eight 3-0 silk stitches. The abdomen was then irrigated copious  amounts of saline. Gloves were changed. The rest of the abdomen was  inspected and no other abnormalities were noted. The fascia of the anterior  abdominal wall was then closed with two running #1 PDS sutures. The  subcutaneous tissue was  irrigated with copious amounts of saline and  Betadine and the skin was closed with staples. Sterile dressings were  applied. The patient tolerated the procedure well. At the end of the case,  all needle, sponge and instrument counts were correct. The patient was then  awakened and taken to recovery room in stable condition.      PST/MEDQ  D:  07/25/2004  T:  07/25/2004  Job:  [id number]

## 2010-07-20 NOTE — H&P (Signed)
NAME:  Wendy Quinn, Wendy Quinn                     ACCOUNT NO.:  [id number]   MEDICAL RECORD NO.:  [id number]                   PATIENT TYPE:  INP   LOCATION:  1843                                 FACILITY:  [facility]   PHYSICIAN:  Genene Churn. Love, M.D.                 DATE OF BIRTH:  1925-08-19   DATE OF ADMISSION:  12/04/2002  DATE OF DISCHARGE:                                HISTORY & PHYSICAL   REASON FOR ADMISSION:  This 77 year old right-handed white single female,  who lives with her sister, is admitted from the emergency room for  evaluation of visual disturbance and left-sided numbness.   HISTORY OF PRESENT ILLNESS:  [name] has no known history of high  blood pressure, diabetes, heart disease or clinical stroke.  In December of  2003 or January of 2004, she was seen by Dr. Peter M. [place], cardiologist,  for palpitations and an arrhythmia but no medications were prescribed.  She  has been on aspirin 81 mg daily and on Wednesday, noted the onset of waves  in her vision associated with left-sided numbness involving her face, arm,  and possibly her leg.  This lasted approximately one hour and was not  associated with any other neurologic symptoms.  This cleared and she was in  her usual state of health today, until approximately 11 a.m., when she had a  similar episode lasting two hours with a visual disturbance and left-sided  numbness.  She came to the emergency room for further evaluation.   PAST MEDICAL HISTORY:  Her past medical history is significant for:  1. Arthritis involving her right knee and the fingers of her hands.  2. Palpitations.  3. Pelvic pain treated with sympathectomy in 1979.  4. Left-sided headaches, evaluated by Dr. Eligha Bridegroom with injections.  5. Dysphagia while taking Celebrex.   SOCIAL HISTORY:  She finished high school.  She is independent in her  activities of daily living, is unmarried, lives with her sister and is a  former Airline pilot.  She has never  married and has no children.   MEDICATIONS:  1. Prempro -- dosage unknown.  2. Aspirin 81 mg daily.  3. Over-the-counter ibuprofen.  4. Calcium 600 mg with vitamin D b.i.d.  5. Ocuvite one daily.  6. Darvocet-N a half every (q) 4 hours as needed (p.r.n.) for pain.   ALLERGIES:  She has a history of allergy to CODEINE, causing nausea and  vomiting, and VALIUM, causing right-sided muscle jerks.   FAMILY HISTORY:  Her family history reveals that her mother died at 24 from  myocardial infarction and her father died at 36 from a stroke.  She has  sisters, 89, 17 and 40, living and well and one brother, 61, living and  well.  She had one brother die at age 50 with [facility] spotted fever.   PHYSICAL EXAMINATION:  GENERAL:  Physical examination revealed a well-  developed, pleasant  white female in no acute distress, mildly anxious.  VITAL SIGNS:  Blood pressure, right and left arms, of 190/85.  Heart rate is  96.  NECK:  No carotid or supraclavicular bruits heard.  Neck flexion and  extension maneuvers are unremarkable.  NEUROLOGIC:  Mental status:  Alert, oriented x3, followed one-, two- and  three-step commands.  No denial of illness or aphasia and no apraxia.  Cranial nerve examination revealed a left inferior quadrantanopsia, disks  flat, spontaneous venous pulsations seen, extraocular movements full,  corneals present, facial sensation equal, no facial motor asymmetry, hearing  present, air conduction greater than bone conduction, tongue midline, uvula  midline, gags presents, sternocleidomastoid and trapezius testing normal.  Motor examination revealed good strength in upper and lower extremities.  Sensory examination intact to pinprick, touch, joint position and vibration  testing.  Deep tendon reflexes 1 to 2+.  Plantar responses downgoing.  HEENT:  On general examination, tympanic membranes clear.  Mouth in good  repair.  LUNGS:  Lungs clear to auscultation.  HEART:   Examination of the heart revealed no murmurs.  BREASTS:  Normal.  ABDOMEN:  Bowel sounds normal.  No enlargement of the liver, spleen and  kidneys.   LABORATORY DATA:  Laboratory data revealed a CT scan without contrast  showing multiple small vessel ischemic periventricular strokes.   EKG showed PACs but otherwise was normal.   IMPRESSION:  1. Right brain stroke, code 434.01.  2. Left visual field cut, code 368.40.  3. Left-sided paresthesia, code 781.2.  4. Palpitations, code 785.1.  5. Bicerebral strokes by CT scan, code 433.31.  6. Osteoarthritis involving right knee and fingers.   PLAN:  Plan at this time is to admit the patient, obtain blood studies and  place on her Plavix, telemetry and obtain MRI/MRA, Doppler, 2-D  echocardiogram, lipid study and homocysteine level and occupational therapy.                                                Genene Churn. Sandria Manly, M.D.    JML/MEDQ  D:  12/04/2002  T:  12/06/2002  Job:  [id number]   cc:   Geoffry Paradise, M.D.  [address]  [place]  [place] [address]  Fax: [phone]

## 2010-07-20 NOTE — Op Note (Signed)
NAME:  Wendy Quinn, Wendy Quinn                     ACCOUNT NO.:  [id number]   MEDICAL RECORD NO.:  [id number]                   PATIENT TYPE:  OIB   LOCATION:  5032                                 FACILITY:  [facility]   PHYSICIAN:  Sharolyn Douglas, M.D.                     DATE OF BIRTH:  1925-12-15   DATE OF PROCEDURE:  07/20/2003  DATE OF DISCHARGE:                                 OPERATIVE REPORT   PREOPERATIVE DIAGNOSIS:  T12 fracture.   POSTOPERATIVE DIAGNOSIS:  T12 fracture.   OPERATION PERFORMED:  1. T12 kyphoplasty.  2. Radiographic interpretation.  Fluoroscopic images used for kyphoplasty.   SURGEON:  Sharolyn Douglas, M.D.   ASSISTANT:  None.   ANESTHESIA:  General endotracheal.  Minimal.   COMPLICATIONS:  None.   INDICATIONS FOR PROCEDURE:  The patient is a 77 year old female with severe  intractable back pain.  X-rays and MRI scan show subacute compression  fracture at T12.  She also had advanced degenerative changes with  spondylolisthesis at L4-5.  Because of her intractable back pain  unresponsive to all conservative care, she has elected to undergo T12  kyphoplasty in hopes of improving her symptoms.   DESCRIPTION OF PROCEDURE:  The patient was properly identified in the  holding area and taken to the operating room and underwent general  endotracheal anesthesia without difficulty.  She was given prophylactic  intravenous antibiotics.  She was carefully positioned on the [place] table  with bolsters under her chest and pelvis to elicit postural reduction of  deformity.  Back prepped and draped in the usual sterile fashion.  Biplanar  fluoroscopy brought into the field.  True AP and lateral images obtained of  the T12 vertebral body.  Small stab incision was made just lateral of the  right T12 pedicle.  Jamshidi needle utilized to cannulate the pedicle under  AP and lateral fluoroscopy.  A working cannula was established over the  Jamshidi needle.  Intravertebral bone tamp  placed anterior and midline.  The  balloon was inflated to 5 mL.  We observed a partial reduction of the  vertebral end plates.  The  balloon was removed.  We then pushed partially  hardened methyl methacrylate cement mixed with barium through the working  cannula into the void created by the balloon tamp.  This was done under live  AP and lateral fluoroscopy.  We had excellent fill of the bone.  The working  cannula was removed.  A simple nylon suture was utilized to close the small  stab incision.  Marcaine was injected for postoperative anesthesia.  Sterile  dressing was applied.  The patient was turned supine, extubated without  difficulty and transferred to the recovery room in stable condition able to  move her upper and lower extremities.  Sharolyn Douglas, M.D.   MC/MEDQ  D:  07/20/2003  T:  07/21/2003  Job:  [id number]

## 2013-11-16 ENCOUNTER — Emergency Department (HOSPITAL_COMMUNITY): Payer: PRIVATE HEALTH INSURANCE

## 2013-11-16 ENCOUNTER — Emergency Department (HOSPITAL_COMMUNITY)
Admission: EM | Admit: 2013-11-16 | Discharge: 2013-11-17 | Disposition: A | Discharge status: Home / Self Care | Payer: PRIVATE HEALTH INSURANCE | Attending: Emergency Medicine | Admitting: Emergency Medicine

## 2013-11-16 ENCOUNTER — Encounter (HOSPITAL_COMMUNITY): Payer: Self-pay | Admitting: Emergency Medicine

## 2013-11-16 DIAGNOSIS — I10 Essential (primary) hypertension: Secondary | ICD-10-CM | POA: Insufficient documentation

## 2013-11-16 DIAGNOSIS — R1110 Vomiting, unspecified: Secondary | ICD-10-CM | POA: Insufficient documentation

## 2013-11-16 DIAGNOSIS — Z8739 Personal history of other diseases of the musculoskeletal system and connective tissue: Secondary | ICD-10-CM | POA: Insufficient documentation

## 2013-11-16 DIAGNOSIS — Z862 Personal history of diseases of the blood and blood-forming organs and certain disorders involving the immune mechanism: Secondary | ICD-10-CM | POA: Diagnosis not present

## 2013-11-16 DIAGNOSIS — Z8509 Personal history of malignant neoplasm of other digestive organs: Secondary | ICD-10-CM | POA: Insufficient documentation

## 2013-11-16 DIAGNOSIS — Z7982 Long term (current) use of aspirin: Secondary | ICD-10-CM | POA: Insufficient documentation

## 2013-11-16 DIAGNOSIS — Z8639 Personal history of other endocrine, nutritional and metabolic disease: Secondary | ICD-10-CM | POA: Insufficient documentation

## 2013-11-16 DIAGNOSIS — Z79899 Other long term (current) drug therapy: Secondary | ICD-10-CM | POA: Diagnosis not present

## 2013-11-16 DIAGNOSIS — R1111 Vomiting without nausea: Secondary | ICD-10-CM

## 2013-11-16 HISTORY — DX: Age-related osteoporosis without current pathological fracture: M81.0

## 2013-11-16 HISTORY — DX: Hyperlipidemia, unspecified: E78.5

## 2013-11-16 HISTORY — DX: Essential (primary) hypertension: I10

## 2013-11-16 HISTORY — DX: Malignant (primary) neoplasm, unspecified: C80.1

## 2013-11-16 HISTORY — DX: Cerebral infarction, unspecified: I63.9

## 2013-11-16 LAB — I-STAT TROPONIN, ED: Troponin i, poc: 0.01 ng/mL (ref 0.00–0.08)

## 2013-11-16 LAB — CBC WITH DIFFERENTIAL/PLATELET
BASOS PCT: 0 % (ref 0–1)
Basophils Absolute: 0 10*3/uL (ref 0.0–0.1)
Eosinophils Absolute: 0.1 10*3/uL (ref 0.0–0.7)
Eosinophils Relative: 1 % (ref 0–5)
HEMATOCRIT: 39.4 % (ref 36.0–46.0)
HEMOGLOBIN: 12.9 g/dL (ref 12.0–15.0)
Lymphocytes Relative: 20 % (ref 12–46)
Lymphs Abs: 1.4 10*3/uL (ref 0.7–4.0)
MCH: 32.3 pg (ref 26.0–34.0)
MCHC: 32.7 g/dL (ref 30.0–36.0)
MCV: 98.7 fL (ref 78.0–100.0)
MONO ABS: 0.5 10*3/uL (ref 0.1–1.0)
MONOS PCT: 7 % (ref 3–12)
NEUTROS ABS: 5 10*3/uL (ref 1.7–7.7)
Neutrophils Relative %: 72 % (ref 43–77)
Platelets: 221 10*3/uL (ref 150–400)
RBC: 3.99 MIL/uL (ref 3.87–5.11)
RDW: 13.5 % (ref 11.5–15.5)
WBC: 7 10*3/uL (ref 4.0–10.5)

## 2013-11-16 LAB — COMPREHENSIVE METABOLIC PANEL
ALBUMIN: 3.7 g/dL (ref 3.5–5.2)
ALK PHOS: 64 U/L (ref 39–117)
ALT: 11 U/L (ref 0–35)
AST: 18 U/L (ref 0–37)
Anion gap: 14 (ref 5–15)
BILIRUBIN TOTAL: 0.3 mg/dL (ref 0.3–1.2)
BUN: 26 mg/dL — ABNORMAL HIGH (ref 6–23)
CHLORIDE: 99 meq/L (ref 96–112)
CO2: 28 mEq/L (ref 19–32)
CREATININE: 1.01 mg/dL (ref 0.50–1.10)
Calcium: 10.3 mg/dL (ref 8.4–10.5)
GFR calc Af Amer: 57 mL/min — ABNORMAL LOW (ref >90)
GFR calc non Af Amer: 49 mL/min — ABNORMAL LOW (ref >90)
Glucose, Bld: 118 mg/dL — ABNORMAL HIGH (ref 70–99)
POTASSIUM: 4.1 meq/L (ref 3.7–5.3)
SODIUM: 141 meq/L (ref 137–147)
Total Protein: 7 g/dL (ref 6.0–8.3)

## 2013-11-16 LAB — PHOSPHORUS: Phosphorus: 4.2 mg/dL (ref 2.3–4.6)

## 2013-11-16 LAB — PROTIME-INR
INR: 0.95 (ref 0.00–1.49)
Prothrombin Time: 12.7 seconds (ref 11.6–15.2)

## 2013-11-16 LAB — APTT: aPTT: 32 seconds (ref 24–37)

## 2013-11-16 LAB — MAGNESIUM: Magnesium: 2 mg/dL (ref 1.5–2.5)

## 2013-11-16 LAB — LIPASE, BLOOD: LIPASE: 39 U/L (ref 11–59)

## 2013-11-16 MED ORDER — PANTOPRAZOLE SODIUM 20 MG PO TBEC: 20.0000 mg | DELAYED_RELEASE_TABLET | Freq: Every day | ORAL | Status: AC

## 2013-11-16 MED ORDER — SODIUM CHLORIDE 0.9 % IV SOLN
INTRAVENOUS | Status: DC
  Administered 2013-11-16: 23:00:00 via INTRAVENOUS

## 2013-11-16 NOTE — ED Notes (Signed)
Bed: [facility] Expected date: 11/16/13 Expected time: 7:44 PM Means of arrival: Ambulance Comments: vomiting

## 2013-11-16 NOTE — ED Notes (Signed)
[facility] called for transportation  

## 2013-11-16 NOTE — ED Notes (Signed)
Per EMS: Pt has had intermittent vomiting x 2 wks, today started projectile vomiting x 4. Hx of dementia. Nurse gave Zofran PO at 1800. No complaints of N/V since EMS arrival.

## 2013-11-16 NOTE — Discharge Instructions (Signed)

## 2013-11-16 NOTE — ED Notes (Signed)
Pt has had no emesis episodes since arrival in ED; pt denies any nausea at this time.

## 2013-11-16 NOTE — ED Provider Notes (Signed)
CSN: [id number]     Arrival date & time 11/16/13  2013 History   First MD Initiated Contact with Patient 11/16/13 2203     Chief Complaint  Patient presents with  . Emesis     (Consider location/radiation/quality/duration/timing/severity/associated sxs/prior Treatment) HPI This patient presents with emesis. EMS reports 2 weeks of intermittent duration, the patient reports one week. She reports that she will vomit sometimes rather unexpectedly. She reports that usually seems to be after the evening meal. She reports that there does not seem to be any particular food triggers this and nothing that makes it better. She has no associated pain. Patient denies any diarrhea or fever. She is also is not endorsing any malaise or general illness.  Past Medical History  Diagnosis Date  . Osteoporosis   . Neoplasm of digestive organ   . Hypertension   . CVA (cerebral infarction)   . Adenocarcinoma   . Hyperlipidemia    History reviewed. No pertinent past surgical history. No family history on file. History  Substance Use Topics  . Smoking status: Never Smoker   . Smokeless tobacco: Not on file  . Alcohol Use: No   OB History   Grav Para Term Preterm Abortions TAB SAB Ect Mult Living                 Review of Systems 10 Systems reviewed and are negative for acute change except as noted in the HPI.  Allergies  Apap; Celebrex; Codeine; Detrol; Oxycodone; Valium; and Vioxx  Home Medications   Prior to Admission medications   Medication Sig Start Date End Date Taking? Authorizing Provider  acetaminophen (TYLENOL) 500 MG chewable tablet Chew 1,000 mg by mouth 2 (two) times daily as needed for pain.   Yes Historical Provider, MD  aspirin 81 MG chewable tablet Chew 81 mg by mouth daily.   Yes Historical Provider, MD  docusate sodium (COLACE) 100 MG capsule Take 100 mg by mouth 2 (two) times daily.   Yes Historical Provider, MD  escitalopram (LEXAPRO) 10 MG tablet Take 10 mg by mouth  daily.   Yes Historical Provider, MD  HYDROcodone-acetaminophen (LORTAB 5) 5-500 MG per tablet Take 1 tablet by mouth every 6 (six) hours as needed for pain.   Yes Historical Provider, MD  losartan (COZAAR) 100 MG tablet Take 100 mg by mouth daily.   Yes Historical Provider, MD  ondansetron (ZOFRAN) 4 MG tablet Take 4 mg by mouth every 6 (six) hours as needed for nausea or vomiting.   Yes Historical Provider, MD  polyethylene glycol (MIRALAX / GLYCOLAX) packet Take 17 g by mouth daily.   Yes Historical Provider, MD  Propylene Glycol (SYSTANE BALANCE) 0.6 % SOLN Place 1 drop into both eyes 3 (three) times daily. For cataracts   Yes Historical Provider, MD  Vitamin D, Ergocalciferol, (DRISDOL) 50000 UNITS CAPS capsule Take 50,000 Units by mouth every 30 (thirty) days.   Yes Historical Provider, MD   BP 135/79  Pulse 80  Temp(Src) 98.5 F (36.9 C) (Oral)  Resp 18  SpO2 95% Physical Exam  Constitutional: She is oriented to person, place, and time. She appears well-developed and well-nourished.  HENT:  Head: Normocephalic and atraumatic.  Eyes: EOM are normal. Pupils are equal, round, and reactive to light.  Neck: Neck supple.  Cardiovascular: Normal rate, regular rhythm, normal heart sounds and intact distal pulses.   Pulmonary/Chest: Effort normal and breath sounds normal.  Abdominal: Soft. Bowel sounds are normal. She exhibits no distension.  There is no tenderness.  Musculoskeletal: Normal range of motion. She exhibits no edema.  Neurological: She is alert and oriented to person, place, and time. She has normal strength. Coordination normal. GCS eye subscore is 4. GCS verbal subscore is 5. GCS motor subscore is 6.  Skin: Skin is warm, dry and intact.  Psychiatric: She has a normal mood and affect.   Rectal examination: All stool in the vault is very soft, there is nothing impacted or hard. Stool brown green. ED Course  Procedures (including critical care time) Labs Review Labs Reviewed   COMPREHENSIVE METABOLIC PANEL - Abnormal; Notable for the following:    Glucose, Bld 118 (*)    BUN 26 (*)    GFR calc non Af Amer 49 (*)    GFR calc Af Amer 57 (*)    All other components within normal limits  LIPASE, BLOOD  CBC WITH DIFFERENTIAL  APTT  PROTIME-INR  MAGNESIUM  PHOSPHORUS  URINALYSIS, ROUTINE W REFLEX MICROSCOPIC  I-STAT TROPOININ, ED    Imaging Review No results found.   EKG Interpretation None      MDM   Final diagnoses:  Non-intractable vomiting without nausea, vomiting of unspecified type   Patient presents for vomiting. At this point time diagnostic workup is within normal limits. She is not showing any signs of dehydration she has no abdominal pain. There is no apparent etiology at this point in time. CT scan does not show an obstructive pattern. There is a large amount of stool in the rectal vault however I performed a rectal exam and find this is not impacted. At this point elevation return to nursing care and continue some PRN Zofran and initiate Protonix.    Charlesetta Shanks, MD 11/16/13 [phone]

## 2013-11-22 ENCOUNTER — Telehealth: Payer: Self-pay | Admitting: Internal Medicine

## 2013-11-22 NOTE — Telephone Encounter (Signed)
Pt has been having nausea, vomiting and abdominal pain. Per nsg home physician Dr. Reynaldo Minium, he would like pt to have GI consult. Pt scheduled to see Tye Savoy NP 11/29/13@2pm .

## 2013-11-26 ENCOUNTER — Encounter: Payer: Self-pay | Admitting: *Deleted

## 2013-11-29 ENCOUNTER — Ambulatory Visit (INDEPENDENT_AMBULATORY_CARE_PROVIDER_SITE_OTHER): Payer: PRIVATE HEALTH INSURANCE | Admitting: Nurse Practitioner

## 2013-11-29 ENCOUNTER — Encounter: Payer: Self-pay | Admitting: Nurse Practitioner

## 2013-11-29 VITALS — BP 130/70 | HR 88 | Ht 65.0 in | Wt 157.0 lb

## 2013-11-29 DIAGNOSIS — R1110 Vomiting, unspecified: Secondary | ICD-10-CM

## 2013-11-29 DIAGNOSIS — R1112 Projectile vomiting: Secondary | ICD-10-CM

## 2013-11-29 DIAGNOSIS — R112 Nausea with vomiting, unspecified: Secondary | ICD-10-CM

## 2013-11-29 NOTE — Patient Instructions (Addendum)
You have been scheduled for an Upper GI Series at [facility] Radiology (1st floor of hospital) on 12-02-2013 at 930 am. Please arrive 15 minutes early for your appointment. If you need to reschedule your appointment, please contact radiology at [phone]. This test typically takes about 60 minutes to perform. PLEASE HAVE NOTHING TO EAT OR DRINK AFTER MIDNIGHT.

## 2013-11-30 ENCOUNTER — Encounter: Payer: Self-pay | Admitting: Nurse Practitioner

## 2013-11-30 DIAGNOSIS — R1110 Vomiting, unspecified: Secondary | ICD-10-CM | POA: Insufficient documentation

## 2013-11-30 NOTE — Progress Notes (Signed)
HPI :  Patient is an 80 year old female known remotely to Dr. Henrene Pastor for GERD with esophageal stricture, diverticulosis and colon polyps. In 2006 she had a colonoscopy with findings of a sessile sigmoid mass. Patient underwent a sigmoid resection. 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 Margins were free of tumor. We haven't seen patient since that time. Review of EPIC records shows that patient presented to ED Sept 2011 with unrelenting abdominal pain. CTscan revealed marked small bowel wall thickening. Patient ultimately underwent an exploratory laparotomy. A segment of her jejunum was necrotic and thus required resection. She also had lysis of adhesion.   Patient comes in today with a family member for evaluation of intermittent, "projectile" vomiting which began about three weeks ago. Emesis has been described to patient as containing undigested food particles. Patient has no abdominal pain whatsoever. Interestingly the episodes of vomiting are not really preceded by nausea, just occur rather suddenly. Episodes are frequently after her evening meal but have also occurred during sleep. No dysphasia. Patient is not just regurgitating food particles, she's having significant heaving during episodes of vomiting. The patient has not made any recent medication changes. She was evaluated in the ED 11/16/13.  Suspect she was a little dehydrated with hemoglobin above baseline and BUN mildly elevated but labs o/w unremarkable.. Lipase and LFTs were normal. A noncontrast CT scan revealed significant amount of stool in the rectosigmoid colon but was otherwise normal.   Past Medical History  Diagnosis Date  . Osteoporosis   . Hypertension   . CVA (cerebral infarction)   . Adenocarcinoma   . Hyperlipidemia   . Stricture and stenosis of esophagus   . Reflux esophagitis   . Duodenitis without mention of hemorrhage   . Diverticulosis of colon (without mention of hemorrhage)     . Internal hemorrhoids without mention of complication   . Other malignant neoplasm without specification of site     Family History  Problem Relation Age of Onset  . Colon cancer Neg Hx   . Colon polyps Neg Hx    History  Substance Use Topics  . Smoking status: Never Smoker   . Smokeless tobacco: Not on file  . Alcohol Use: No   Current Outpatient Prescriptions  Medication Sig Dispense Refill  . acetaminophen (TYLENOL) 500 MG chewable tablet Chew 1,000 mg by mouth 2 (two) times daily as needed for pain.      Marland Kitchen aspirin 81 MG chewable tablet Chew 81 mg by mouth daily.      Marland Kitchen docusate sodium (COLACE) 100 MG capsule Take 100 mg by mouth 2 (two) times daily.      Marland Kitchen escitalopram (LEXAPRO) 10 MG tablet Take 10 mg by mouth daily.      Marland Kitchen HYDROcodone-acetaminophen (LORTAB 5) 5-500 MG per tablet Take 1 tablet by mouth every 6 (six) hours as needed for pain.      Marland Kitchen losartan (COZAAR) 100 MG tablet Take 100 mg by mouth daily.      . ondansetron (ZOFRAN) 4 MG tablet Take 4 mg by mouth every 6 (six) hours as needed for nausea or vomiting.      . pantoprazole (PROTONIX) 20 MG tablet Take 1 tablet (20 mg total) by mouth daily.  30 tablet  0  . polyethylene glycol (MIRALAX / GLYCOLAX) packet Take 17 g by mouth daily.      Marland Kitchen Propylene Glycol (SYSTANE BALANCE) 0.6 % SOLN Place 1 drop into both eyes 3 (three)  times daily. For cataracts      . Vitamin D, Ergocalciferol, (DRISDOL) 50000 UNITS CAPS capsule Take 50,000 Units by mouth every 30 (thirty) days.       No current facility-administered medications for this visit.   Allergies  Allergen Reactions  . Apap [Acetaminophen]     Unknown, listed on MAR  . Celebrex [Celecoxib]     Unknown, listed on MAR  . Codeine     unknown  . Detrol [Tolterodine]     Unknown, listed on MAR  . Oxycodone     Unknown, listed on MAR  . Valium [Diazepam]     Unknown, listed on MAR  . Vioxx [Rofecoxib]     Unknown, listed on MAR   Review of Systems: All  systems reviewed and negative except where noted in HPI.   Ct Renal Stone Study  11/17/2013   CLINICAL DATA:  Intermittent vomiting for 2 weeks. Projectile vomiting. History of dementia.  EXAM: CT ABDOMEN AND PELVIS WITHOUT CONTRAST  TECHNIQUE: Multidetector CT imaging of the abdomen and pelvis was performed following the standard protocol without IV contrast.  COMPARISON:  11/09/2009  FINDINGS: Lower chest: There is mild reticular change at the left lung base, incompletely characterized. No definite consolidations. Heart size is normal. Coronary vascular calcifications are present.  Upper abdomen: No focal abnormality identified within the liver, spleen, pancreas, adrenal glands, or kidneys. Gallbladder is present.  Bowel: The stomach has a normal appearance. Small bowel loops are normal in appearance. There is moderate stool burden. Colonic loops are nondilated. Bowel suture line is identified in the right lower quadrant.  Pelvis: No adnexal mass.  No free pelvic fluid.  Retroperitoneum: There is atherosclerotic calcification of the abdominal aorta. Aorta is tortuous but not aneurysmal.  Abdominal wall: Possible small midline abdominal hernia containing only mesenteric fat. There is significant artifact in this region however. No evidence for related bowel obstruction.  Osseous structures: Old left pubic fracture. Previous T12 kyphoplasty. There is a wedge compression fracture of L4. Anterolisthesis of L4 on L5 is 5 mm. No suspicious lytic or blastic lesions.  IMPRESSION: 1. Moderate stool burden. There is significant stool in the rectosigmoid colon. 2. Coronary artery calcifications. 3. Suspect midline abdominal hernia containing only mesenteric fat. 4. Old fractures.   Electronically Signed   By: Shon Hale M.D.   On: 11/17/2013 00:01    Physical Exam: BP 130/70  Pulse 88  Ht 5\' 5"  (1.651 m)  Wt 157 lb (71.215 kg)  BMI 26.13 kg/m2 Constitutional: well-developed, white female in a wheelchair in no  acute distress. HEENT: Normocephalic and atraumatic. Conjunctivae are normal. No scleral icterus. Neck supple.  Cardiovascular: Normal rate, regular rhythm.  Pulmonary/chest: Effort normal and breath sounds normal. No wheezing, rales or rhonchi. Abdominal: Exam limited by patient's inability to get onto exam table.Abdomen soft, nondistended, nontender. Bowel sounds active throughout.  Lymphadenopathy: No cervical adenopathy noted. Neurological: Alert and oriented to person place and time. Marland KitchenPsychiatric: Normal mood and affect. Behavior is normal.   ASSESSMENT AND PLAN:  29. 80 year old female with multiple medical problems presenting with a three week history of intermittent, "projectile" vomiting in absence of significant vomiting. No obstructive process on non-contrast CTscan.  Etiology unclear, doesn't sound like gastroparesis secondary to  pain medication. Partial obstruction from recurrent adhesions is a possibility. Advanced age / multiple co-morbidities puts patient at high risk for endoscopic procedures. Will obtain UGI series with SBFT for further evaluation.   2. 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 Hx of mesenteric ischemia, s/p exp lap with LOA and resection of necrotic jejunum in 2011.   4. Constipation by recent non-contrast CTscan done in ED. Patient doesn't know if this was treated upon return to assisted living facility but per family member patient is having daily BMs.  I will recommend enemas if there is ongoing concern for constipation.

## 2013-11-30 NOTE — Progress Notes (Signed)
Agree with thoughtful assessment and plan as outlined

## 2013-12-02 ENCOUNTER — Other Ambulatory Visit: Payer: Self-pay | Admitting: Nurse Practitioner

## 2013-12-02 ENCOUNTER — Ambulatory Visit (HOSPITAL_COMMUNITY): Payer: PRIVATE HEALTH INSURANCE

## 2013-12-02 ENCOUNTER — Ambulatory Visit (HOSPITAL_COMMUNITY)
Admission: RE | Admit: 2013-12-02 | Discharge: 2013-12-02 | Disposition: A | Discharge status: Home / Self Care | Payer: PRIVATE HEALTH INSURANCE | Source: Ambulatory Visit | Attending: Diagnostic Radiology | Admitting: Diagnostic Radiology

## 2013-12-02 DIAGNOSIS — R1112 Projectile vomiting: Secondary | ICD-10-CM | POA: Diagnosis present

## 2013-12-02 DIAGNOSIS — Z9049 Acquired absence of other specified parts of digestive tract: Secondary | ICD-10-CM | POA: Diagnosis not present

## 2013-12-06 ENCOUNTER — Other Ambulatory Visit: Payer: Self-pay | Admitting: Internal Medicine

## 2013-12-06 DIAGNOSIS — R1112 Projectile vomiting: Secondary | ICD-10-CM

## 2013-12-06 DIAGNOSIS — R110 Nausea: Secondary | ICD-10-CM

## 2013-12-07 ENCOUNTER — Ambulatory Visit
Admission: RE | Admit: 2013-12-07 | Discharge: 2013-12-07 | Disposition: A | Discharge status: Home / Self Care | Payer: Medicare Other | Source: Ambulatory Visit | Attending: Internal Medicine | Admitting: Internal Medicine

## 2013-12-07 DIAGNOSIS — R110 Nausea: Secondary | ICD-10-CM

## 2013-12-07 DIAGNOSIS — R1112 Projectile vomiting: Secondary | ICD-10-CM

## 2016-05-28 ENCOUNTER — Other Ambulatory Visit (HOSPITAL_COMMUNITY): Payer: Self-pay | Admitting: Internal Medicine

## 2016-05-28 DIAGNOSIS — R1319 Other dysphagia: Secondary | ICD-10-CM

## 2016-06-04 ENCOUNTER — Ambulatory Visit (HOSPITAL_COMMUNITY)
Admission: RE | Admit: 2016-06-04 | Discharge: 2016-06-04 | Disposition: A | Discharge status: Home / Self Care | Payer: PRIVATE HEALTH INSURANCE | Source: Ambulatory Visit | Attending: Internal Medicine | Admitting: Internal Medicine

## 2016-06-04 DIAGNOSIS — R1312 Dysphagia, oropharyngeal phase: Secondary | ICD-10-CM | POA: Insufficient documentation

## 2016-06-04 DIAGNOSIS — R1319 Other dysphagia: Secondary | ICD-10-CM

## 2016-08-01 ENCOUNTER — Encounter (HOSPITAL_BASED_OUTPATIENT_CLINIC_OR_DEPARTMENT_OTHER): Payer: 59 | Attending: Internal Medicine

## 2016-08-01 DIAGNOSIS — I10 Essential (primary) hypertension: Secondary | ICD-10-CM | POA: Diagnosis not present

## 2016-08-01 DIAGNOSIS — Z8673 Personal history of transient ischemic attack (TIA), and cerebral infarction without residual deficits: Secondary | ICD-10-CM | POA: Insufficient documentation

## 2016-08-01 DIAGNOSIS — L89892 Pressure ulcer of other site, stage 2: Secondary | ICD-10-CM | POA: Insufficient documentation

## 2016-08-15 ENCOUNTER — Encounter (HOSPITAL_BASED_OUTPATIENT_CLINIC_OR_DEPARTMENT_OTHER): Payer: PRIVATE HEALTH INSURANCE | Attending: Internal Medicine

## 2016-08-15 DIAGNOSIS — I10 Essential (primary) hypertension: Secondary | ICD-10-CM | POA: Insufficient documentation

## 2016-08-15 DIAGNOSIS — M24542 Contracture, left hand: Secondary | ICD-10-CM | POA: Insufficient documentation

## 2016-08-15 DIAGNOSIS — L89892 Pressure ulcer of other site, stage 2: Secondary | ICD-10-CM | POA: Insufficient documentation

## 2016-08-29 DIAGNOSIS — L89892 Pressure ulcer of other site, stage 2: Secondary | ICD-10-CM | POA: Diagnosis not present

## 2016-09-12 ENCOUNTER — Encounter (HOSPITAL_BASED_OUTPATIENT_CLINIC_OR_DEPARTMENT_OTHER): Payer: PRIVATE HEALTH INSURANCE | Attending: Internal Medicine

## 2016-09-12 DIAGNOSIS — I10 Essential (primary) hypertension: Secondary | ICD-10-CM | POA: Diagnosis not present

## 2016-09-12 DIAGNOSIS — L89892 Pressure ulcer of other site, stage 2: Secondary | ICD-10-CM | POA: Diagnosis present

## 2016-09-12 DIAGNOSIS — M24542 Contracture, left hand: Secondary | ICD-10-CM | POA: Diagnosis not present

## 2016-09-26 DIAGNOSIS — L89892 Pressure ulcer of other site, stage 2: Secondary | ICD-10-CM | POA: Diagnosis not present

## 2016-10-10 ENCOUNTER — Encounter (HOSPITAL_BASED_OUTPATIENT_CLINIC_OR_DEPARTMENT_OTHER): Payer: Medicare Other | Attending: Internal Medicine

## 2016-10-10 DIAGNOSIS — L89892 Pressure ulcer of other site, stage 2: Secondary | ICD-10-CM | POA: Diagnosis not present

## 2016-10-10 DIAGNOSIS — I10 Essential (primary) hypertension: Secondary | ICD-10-CM | POA: Diagnosis not present

## 2016-12-23 ENCOUNTER — Other Ambulatory Visit: Payer: Self-pay | Admitting: Internal Medicine

## 2016-12-25 ENCOUNTER — Other Ambulatory Visit: Payer: Self-pay | Admitting: Specialist

## 2016-12-25 ENCOUNTER — Other Ambulatory Visit: Payer: Self-pay | Admitting: Family

## 2016-12-25 DIAGNOSIS — M79645 Pain in left finger(s): Secondary | ICD-10-CM

## 2017-01-03 ENCOUNTER — Inpatient Hospital Stay
Admission: RE | Admit: 2017-01-03 | Discharge: 2017-01-03 | Disposition: A | Discharge status: Home / Self Care | Payer: Medicare Other | Source: Ambulatory Visit | Attending: Family | Admitting: Family

## 2017-01-16 ENCOUNTER — Other Ambulatory Visit: Payer: Medicare Other

## 2017-02-01 DEATH — deceased

## 2017-02-06 ENCOUNTER — Encounter (HOSPITAL_BASED_OUTPATIENT_CLINIC_OR_DEPARTMENT_OTHER): Payer: PRIVATE HEALTH INSURANCE

## 2017-04-11 IMAGING — RF DG SWALLOWING FUNCTION
8 series · 19 of 24 positions shown · non-contrast
Comparison: None.

CLINICAL DATA: 89-year-old female with dysphagia. Initial
encounter.

EXAM:
MODIFIED BARIUM SWALLOW
TECHNIQUE: Different consistencies of barium were administered orally to the
patient by the Speech Pathologist. Imaging of the pharynx was
performed in the lateral projection.
FLUOROSCOPY TIME:  Fluoroscopy Time:  2 minutes and 12 seconds
Radiation Exposure Index (if provided by the fluoroscopic device):
6.6 mGy

[Series 1: cp_standard · 0.34mm/px · 2 of 221 frames shown (1 of 8)]
[frame 34/221]
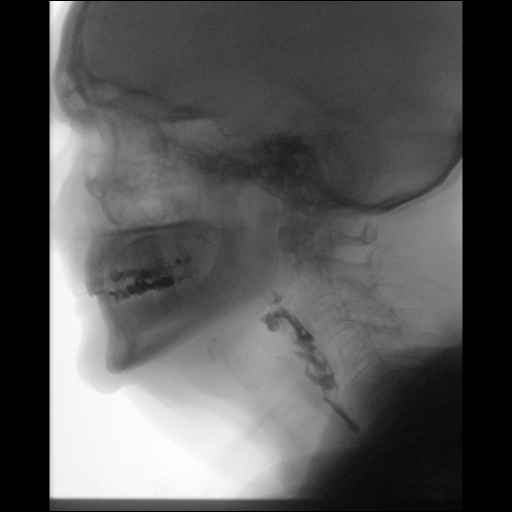
[frame 54/221]
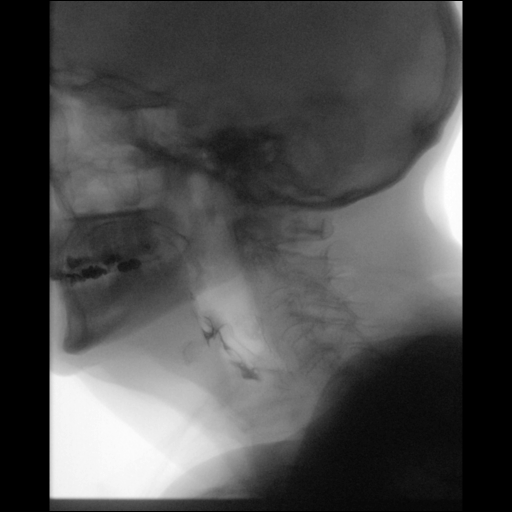

[Series 2: cp_standard · 0.34mm/px · 3 of 64 frames shown (2 of 8)]
[frame 10/64]
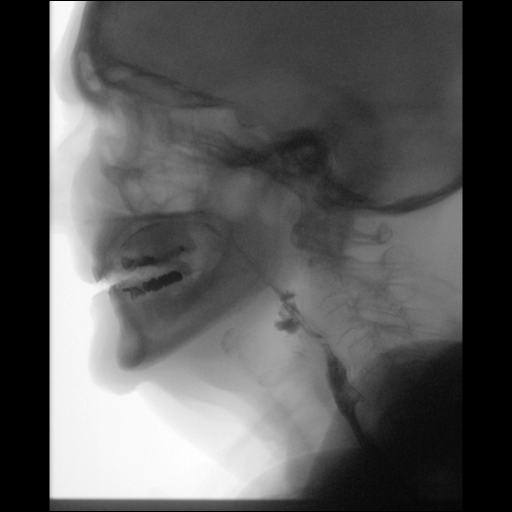
[frame 15/64]
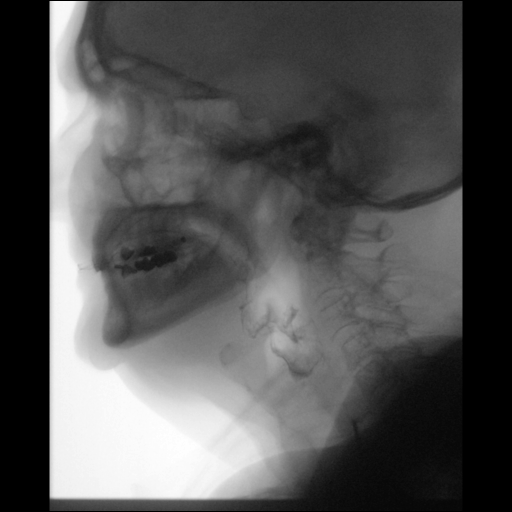
[frame 55/64]
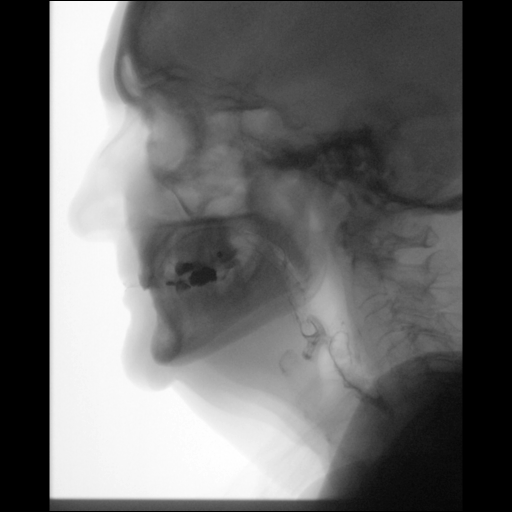

[Series 3: cp_standard · 0.34mm/px · 2 of 211 frames shown (3 of 8)]
[frame 32/211]
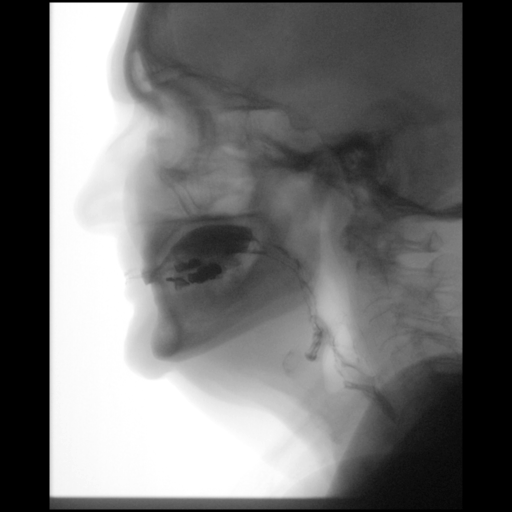
[frame 180/211]
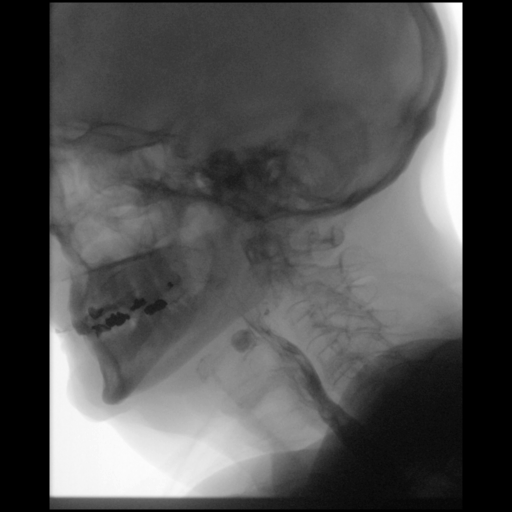

[Series 4: cp_standard · 0.34mm/px · 2 of 413 frames shown (4 of 8)]
[frame 62/413]
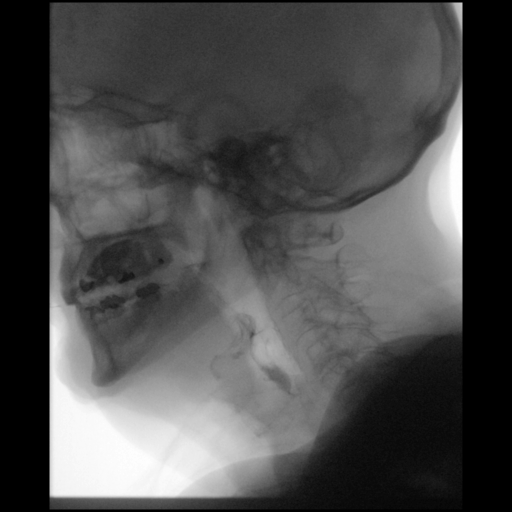
[frame 207/413]
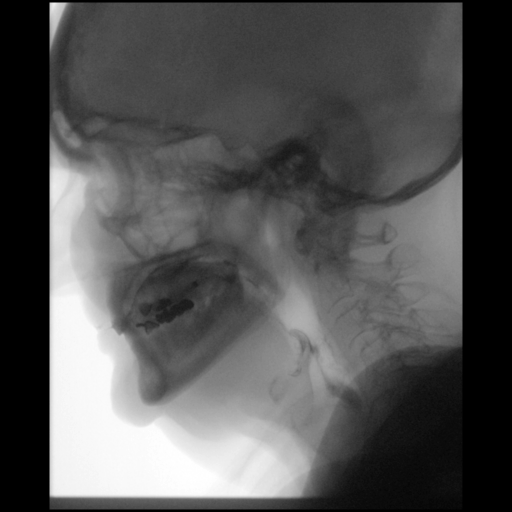

[Series 5: cp_standard · 0.34mm/px · 3 of 22 frames shown (5 of 8)]
[frame 2/22]
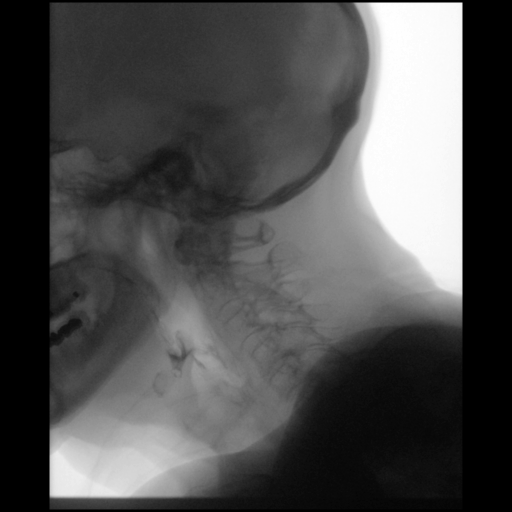
[frame 12/22]
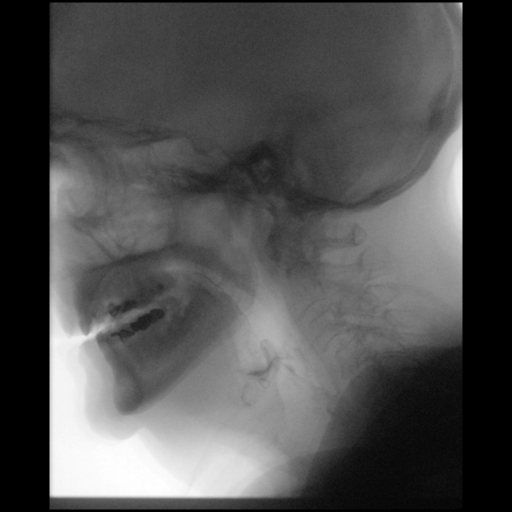
[frame 19/22]
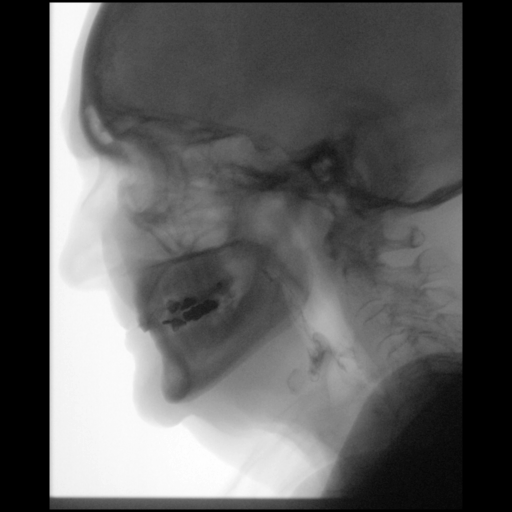

[Series 6: cp_standard · 0.34mm/px · 2 of 106 frames shown (6 of 8)]
[frame 16/106]
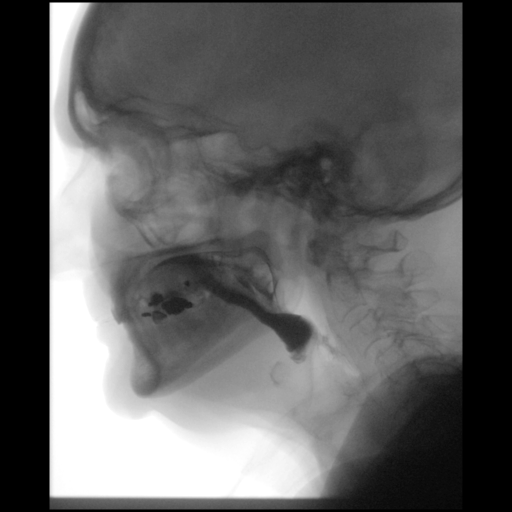
[frame 91/106]
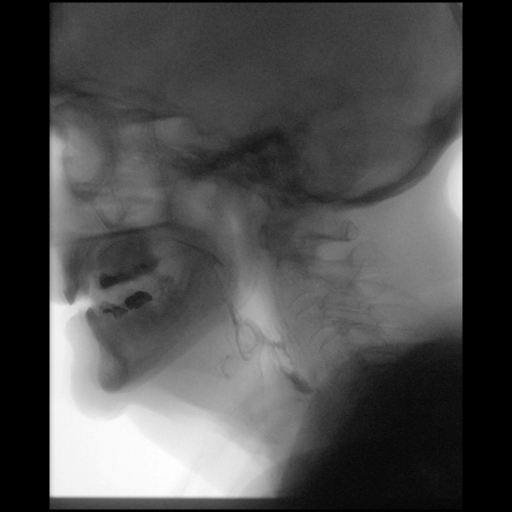

[Series 7: cp_standard · 0.34mm/px · 3 of 120 frames shown (7 of 8)]
[frame 19/120]
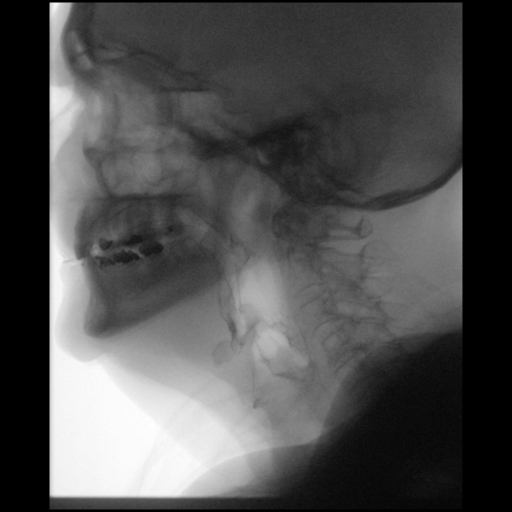
[frame 61/120]
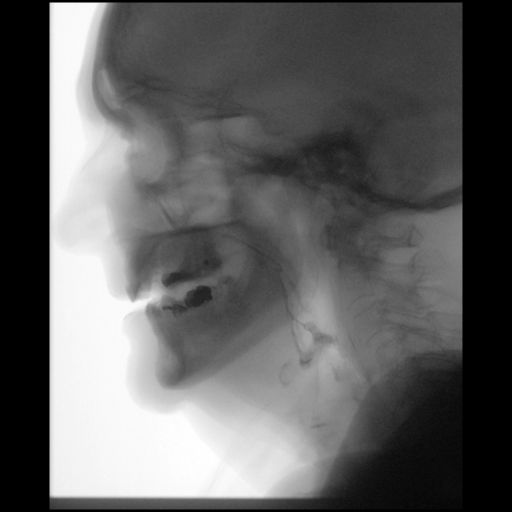
[frame 103/120]
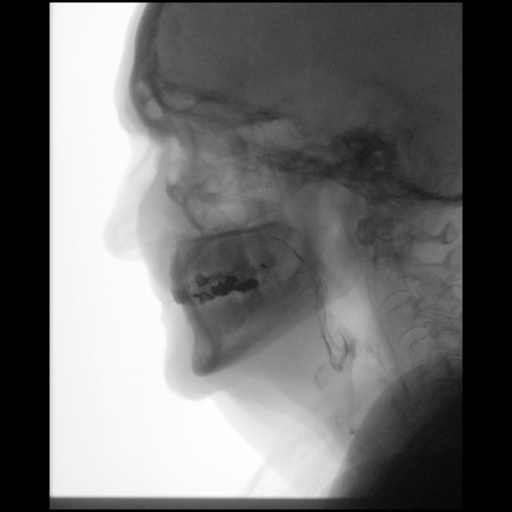

[Series 8: cp_standard · 0.34mm/px · 2 of 91 frames shown (8 of 8)]
[frame 46/91]
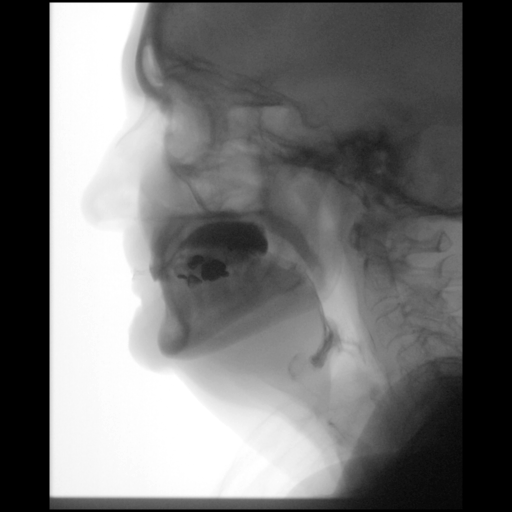
[frame 78/91]
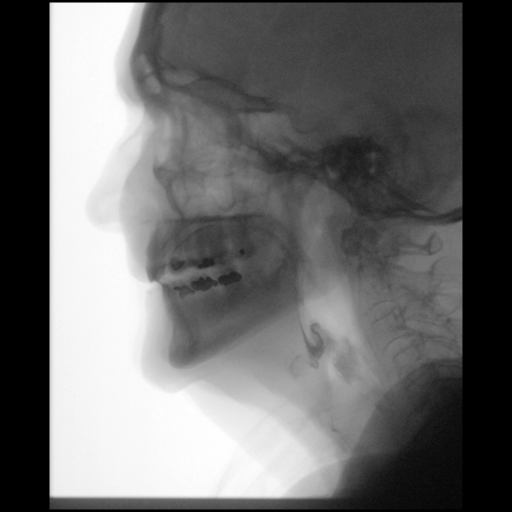

[19 of 24 positions shown; findings below may reference images not displayed]

FINDINGS: Thin liquid- premature spilling.

Nectar thick liquid- premature spilling. Episodes of laryngeal
penetration. Minimal aspiration.

Loredo?Curvaceous delayed oral transit.  Premature spilling.

Loredo?[place] with cracker- delayed oral transit.  Premature spilling.
IMPRESSION: Laryngeal penetration and minimal aspiration with ingestion of
nectar liquid consistency.

Premature spilling and delayed oral transit as discussed above.

Please refer to the Speech Pathologists report for complete details
and recommendations.
